# Patient Record
Sex: Female | Born: 1980 | Race: Black or African American | Hispanic: No | Marital: Single | Smoking: Never smoker
Health system: Southern US, Community
[De-identification: ages and names within clinical notes are randomized; demographics above are authoritative.]

## PROBLEM LIST (undated history)

## (undated) ENCOUNTER — Emergency Department (HOSPITAL_BASED_OUTPATIENT_CLINIC_OR_DEPARTMENT_OTHER): Discharge status: Absent without leave | Payer: Managed Care, Other (non HMO)

---

## 2011-11-07 ENCOUNTER — Other Ambulatory Visit (HOSPITAL_COMMUNITY)
Admission: RE | Admit: 2011-11-07 | Discharge: 2011-11-07 | Disposition: A | Discharge status: Home / Self Care | Payer: Managed Care, Other (non HMO) | Source: Ambulatory Visit | Attending: Obstetrics and Gynecology | Admitting: Obstetrics and Gynecology

## 2011-11-07 DIAGNOSIS — Z1159 Encounter for screening for other viral diseases: Secondary | ICD-10-CM | POA: Insufficient documentation

## 2011-11-07 DIAGNOSIS — Z113 Encounter for screening for infections with a predominantly sexual mode of transmission: Secondary | ICD-10-CM | POA: Insufficient documentation

## 2011-11-07 DIAGNOSIS — Z124 Encounter for screening for malignant neoplasm of cervix: Secondary | ICD-10-CM | POA: Insufficient documentation

## 2015-08-15 ENCOUNTER — Emergency Department (HOSPITAL_BASED_OUTPATIENT_CLINIC_OR_DEPARTMENT_OTHER): Payer: Medicaid Other

## 2015-08-15 ENCOUNTER — Emergency Department (HOSPITAL_BASED_OUTPATIENT_CLINIC_OR_DEPARTMENT_OTHER)
Admission: EM | Admit: 2015-08-15 | Discharge: 2015-08-15 | Disposition: A | Discharge status: Home / Self Care | Payer: Medicaid Other | Attending: Emergency Medicine | Admitting: Emergency Medicine

## 2015-08-15 ENCOUNTER — Encounter (HOSPITAL_BASED_OUTPATIENT_CLINIC_OR_DEPARTMENT_OTHER): Payer: Self-pay | Admitting: Emergency Medicine

## 2015-08-15 DIAGNOSIS — M545 Low back pain, unspecified: Secondary | ICD-10-CM

## 2015-08-15 DIAGNOSIS — M25561 Pain in right knee: Secondary | ICD-10-CM

## 2015-08-15 DIAGNOSIS — Y998 Other external cause status: Secondary | ICD-10-CM | POA: Insufficient documentation

## 2015-08-15 DIAGNOSIS — Y92410 Unspecified street and highway as the place of occurrence of the external cause: Secondary | ICD-10-CM | POA: Insufficient documentation

## 2015-08-15 DIAGNOSIS — S3992XA Unspecified injury of lower back, initial encounter: Secondary | ICD-10-CM | POA: Insufficient documentation

## 2015-08-15 DIAGNOSIS — S8991XA Unspecified injury of right lower leg, initial encounter: Secondary | ICD-10-CM | POA: Diagnosis not present

## 2015-08-15 DIAGNOSIS — Y9389 Activity, other specified: Secondary | ICD-10-CM | POA: Insufficient documentation

## 2015-08-15 MED ORDER — NAPROXEN 500 MG PO TABS: 500.0000 mg | ORAL_TABLET | Freq: Two times a day (BID) | ORAL | Status: AC

## 2015-08-15 MED ORDER — METHOCARBAMOL 500 MG PO TABS: 500.0000 mg | ORAL_TABLET | Freq: Two times a day (BID) | ORAL | Status: AC
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
## 2015-08-15 NOTE — Discharge Instructions (Signed)
Back Pain, Adult   Back pain is very common in adults. The cause of back pain is rarely dangerous and the pain often gets better over time. The cause of your back pain may not be known. Some common causes of back pain include:   Strain of the muscles or ligaments supporting the spine.   Wear and tear (degeneration) of the spinal disks.   Arthritis.       Direct injury to the back.  For many people, back pain may return. Since back pain is rarely dangerous, most people can learn to manage this condition on their own.   HOME CARE INSTRUCTIONS   Watch your back pain for any changes. The following actions may help to lessen any discomfort you are feeling:   Remain active. It is stressful on your back to sit or stand in one place for long periods of time. Do not sit, drive, or stand in one place for more than 30 minutes at a time. Take short walks on even surfaces as soon as you are able. Try to increase the length of time you walk each day.   Exercise regularly as directed by your health care provider. Exercise helps your back heal faster. It also helps avoid future injury by keeping your muscles strong and flexible.   Do not stay in bed. Resting more than 1-2 days can delay your recovery.   Pay attention to your body when you bend and lift. The most comfortable positions are those that put less stress on your recovering back. Always use proper lifting techniques, including:   Bending your knees.   Keeping the load close to your body.   Avoiding twisting.  Find a comfortable position to sleep. Use a firm mattress and lie on your side with your knees slightly bent. If you lie on your back, put a pillow under your knees.   Avoid feeling anxious or stressed. Stress increases muscle tension and can worsen back pain. It is important to recognize when you are anxious or stressed and learn ways to manage it, such as with exercise.   Take medicines only as directed by your health care provider. Over-the-counter medicines to  reduce pain and inflammation are often the most helpful. Your health care provider may prescribe muscle relaxant drugs. These medicines help dull your pain so you can more quickly return to your normal activities and healthy exercise.   Apply ice to the injured area:   Put ice in a plastic bag.   Place a towel between your skin and the bag.   Leave the ice on for 20 minutes, 2-3 times a day for the first 2-3 days. After that, ice and heat may be alternated to reduce pain and spasms.  Maintain a healthy weight. Excess weight puts extra stress on your back and makes it difficult to maintain good posture.  SEEK MEDICAL CARE IF:   You have pain that is not relieved with rest or medicine.   You have increasing pain going down into the legs or buttocks.   You have pain that does not improve in one week.   You have night pain.   You lose weight.   You have a fever or chills.  SEEK IMMEDIATE MEDICAL CARE IF:   You develop new bowel or bladder control problems.   You have unusual weakness or numbness in your arms or legs.   You develop nausea or vomiting.   You develop abdominal pain.   You feel faint.    This information is not intended to replace advice given to you by your health care provider. Make sure you discuss any questions you have with your health care provider.   Document Released: 09/12/2005 Document Revised: 10/03/2014 Document Reviewed: 01/14/2014   [employer]vier Interactive Patient Education 2016 [employer]   Joint Pain   Joint pain, which is also called arthralgia, can be caused by many things. Joint pain often goes away when you follow your health care provider's instructions for relieving pain at home. However, joint pain can also be caused by conditions that require further treatment. Common causes of joint pain include:   Bruising in the area of the joint.   Overuse of the joint.   Wear and tear on the joints that occur with aging (osteoarthritis).   Various other forms of arthritis.   A buildup of a  crystal form of uric acid in the joint (gout).   Infections of the joint (septic arthritis) or of the bone (osteomyelitis).  Your health care provider may recommend medicine to help with the pain. If your joint pain continues, additional tests may be needed to diagnose your condition.   HOME CARE INSTRUCTIONS   Watch your condition for any changes. Follow these instructions as directed to lessen the pain that you are feeling.   Take medicines only as directed by your health care provider.   Rest the affected area for as long as your health care provider says that you should. If directed to do so, raise the painful joint above the level of your heart while you are sitting or lying down.   Do not do things that cause or worsen pain.   If directed, apply ice to the painful area:   Put ice in a plastic bag.   Place a towel between your skin and the bag.   Leave the ice on for 20 minutes, 2-3 times per day.  Wear an elastic bandage, splint, or sling as directed by your health care provider. Loosen the elastic bandage or splint if your fingers or toes become numb and tingle, or if they turn cold and blue.   Begin exercising or stretching the affected area as directed by your health care provider. Ask your health care provider what types of exercise are safe for you.   Keep all follow-up visits as directed by your health care provider. This is important.  SEEK MEDICAL CARE IF:   Your pain increases, and medicine does not help.   Your joint pain does not improve within 3 days.   You have increased bruising or swelling.   You have a fever.   You lose 10 lb (4.5 kg) or more without trying.  SEEK IMMEDIATE MEDICAL CARE IF:   You are not able to move the joint.   Your fingers or toes become numb or they turn cold and blue.  This information is not intended to replace advice given to you by your health care provider. Make sure you discuss any questions you have with your health care provider.   Document Released: 09/12/2005  Document Revised: 10/03/2014 Document Reviewed: 06/24/2014   [employer]vier Interactive Patient Education 2016 [employer]
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
